# Patient Record
Sex: Male | Born: 1995 | Race: White | Hispanic: No | Marital: Single | Smoking: Never smoker
Health system: Southern US, Community
[De-identification: ages and names within clinical notes are randomized; demographics above are authoritative.]

## PROBLEM LIST (undated history)

## (undated) DIAGNOSIS — F909 Attention-deficit hyperactivity disorder, unspecified type: Secondary | ICD-10-CM

## (undated) HISTORY — PX: OTHER SURGICAL HISTORY: SHX169

## (undated) HISTORY — DX: Attention-deficit hyperactivity disorder, unspecified type: F90.9

---
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
## 2015-11-10 ENCOUNTER — Emergency Department (HOSPITAL_COMMUNITY)
Admission: EM | Admit: 2015-11-10 | Discharge: 2015-11-10 | Disposition: A | Discharge status: Home / Self Care | Payer: 59 | Attending: Emergency Medicine | Admitting: Emergency Medicine

## 2015-11-10 ENCOUNTER — Encounter (HOSPITAL_COMMUNITY): Payer: Self-pay | Admitting: Emergency Medicine

## 2015-11-10 DIAGNOSIS — Z8659 Personal history of other mental and behavioral disorders: Secondary | ICD-10-CM | POA: Diagnosis not present

## 2015-11-10 DIAGNOSIS — R630 Anorexia: Secondary | ICD-10-CM | POA: Diagnosis not present

## 2015-11-10 DIAGNOSIS — J028 Acute pharyngitis due to other specified organisms: Secondary | ICD-10-CM | POA: Diagnosis not present

## 2015-11-10 DIAGNOSIS — J029 Acute pharyngitis, unspecified: Secondary | ICD-10-CM

## 2015-11-10 DIAGNOSIS — B9689 Other specified bacterial agents as the cause of diseases classified elsewhere: Secondary | ICD-10-CM | POA: Insufficient documentation

## 2015-11-10 LAB — RAPID STREP SCREEN (MED CTR MEBANE ONLY): Streptococcus, Group A Screen (Direct): NEGATIVE

## 2015-11-10 MED ORDER — DEXAMETHASONE SODIUM PHOSPHATE 10 MG/ML IJ SOLN
10.0000 mg | Freq: Once | INTRAMUSCULAR | Status: AC
  Administered 2015-11-10: 10 mg via INTRAMUSCULAR
  Filled 2015-11-10: qty 1

## 2015-11-10 MED ORDER — PENICILLIN G BENZATHINE 1200000 UNIT/2ML IM SUSP
1.2000 10*6.[IU] | Freq: Once | INTRAMUSCULAR | Status: AC
  Administered 2015-11-10: 1.2 10*6.[IU] via INTRAMUSCULAR
  Filled 2015-11-10: qty 2

## 2015-11-10 NOTE — Discharge Instructions (Signed)
1. Medications: usual home medications 2. Treatment: rest, drink plenty of fluids, Tylenol and ibuprofen for sore throat 3. Follow Up: Please followup with your primary doctor in 3-5 days for discussion of your diagnoses and further evaluation after today's visit; if you do not have a primary care doctor use the resource guide provided to find one; Please return to the ER for difficulty breathing, difficulty swallowing him a high fevers or other concerns    Pharyngitis Pharyngitis is redness, pain, and swelling (inflammation) of your pharynx.  CAUSES  Pharyngitis is usually caused by infection. Most of the time, these infections are from viruses (viral) and are part of a cold. However, sometimes pharyngitis is caused by bacteria (bacterial). Pharyngitis can also be caused by allergies. Viral pharyngitis may be spread from person to person by coughing, sneezing, and personal items or utensils (cups, forks, spoons, toothbrushes). Bacterial pharyngitis may be spread from person to person by more intimate contact, such as kissing.  SIGNS AND SYMPTOMS  Symptoms of pharyngitis include:   Sore throat.   Tiredness (fatigue).   Low-grade fever.   Headache.  Joint pain and muscle aches.  Skin rashes.  Swollen lymph nodes.  Plaque-like film on throat or tonsils (often seen with bacterial pharyngitis). DIAGNOSIS  Your health care provider will ask you questions about your illness and your symptoms. Your medical history, along with a physical exam, is often all that is needed to diagnose pharyngitis. Sometimes, a rapid strep test is done. Other lab tests may also be done, depending on the suspected cause.  TREATMENT  Viral pharyngitis will usually get better in 3-4 days without the use of medicine. Bacterial pharyngitis is treated with medicines that kill germs (antibiotics).  HOME CARE INSTRUCTIONS   Drink enough water and fluids to keep your urine clear or pale yellow.   Only take  over-the-counter or prescription medicines as directed by your health care provider:   If you are prescribed antibiotics, make sure you finish them even if you start to feel better.   Do not take aspirin.   Get lots of rest.   Gargle with 8 oz of salt water ( tsp of salt per 1 qt of water) as often as every 1-2 hours to soothe your throat.   Throat lozenges (if you are not at risk for choking) or sprays may be used to soothe your throat. SEEK MEDICAL CARE IF:   You have large, tender lumps in your neck.  You have a rash.  You cough up green, yellow-brown, or bloody spit. SEEK IMMEDIATE MEDICAL CARE IF:   Your neck becomes stiff.  You drool or are unable to swallow liquids.  You vomit or are unable to keep medicines or liquids down.  You have severe pain that does not go away with the use of recommended medicines.  You have trouble breathing (not caused by a stuffy nose). MAKE SURE YOU:   Understand these instructions.  Will watch your condition.  Will get help right away if you are not doing well or get worse.   This information is not intended to replace advice given to you by your health care provider. Make sure you discuss any questions you have with your health care provider.   Document Released: 10/20/2005 Document Revised: 08/10/2013 Document Reviewed: 06/27/2013 Elsevier Interactive Patient Education [employer]2016 [employer]

## 2015-11-10 NOTE — ED Provider Notes (Signed)
CSN: [id number]     Arrival date & time 11/10/15  1422 History  By signing my name below, I, Joseph Sosa, attest that this documentation has been prepared under the direction and in the presence of [facility] Mut[facility] PA-C. Electronically Signed: Bethel BornBritney Sosa, ED Scribe. 11/10/2015 4:13 PM   Chief Complaint  Patient presents with  . Sore Throat    The history is provided by the patient and medical records. No language interpreter was used.   Joseph Sosa is a 20 y.o. male who presents to the Emergency Department complaining of new, constant, 4/10 in severity sore throat with onset 3 days ago. The pain is worse with swallowing but pt denies difficulty swallowing. He notes that this pain is different than pain that he had in the past with mono. He used Advil and Sudafed for pain at home. Associated symptoms include chills, throat swelling, lymphadenopathy, nasal congestion, ear pain, mild cough, decreased appetite (last ate sushi last night). Pt denies fever, rhinorrhea, and SOB.   Past Medical History  Diagnosis Date  . ADHD (attention deficit hyperactivity disorder)    History reviewed. No pertinent past surgical history. No family history on file. Social History  Substance Use Topics  . Smoking status: Never Smoker   . Smokeless tobacco: Never Used  . Alcohol Use: No    Review of Systems  Constitutional: Positive for chills and appetite change. Negative for fever.  HENT: Positive for congestion, ear pain and sore throat. Negative for rhinorrhea and trouble swallowing.   Respiratory: Positive for cough. Negative for shortness of breath.   Hematological: Positive for adenopathy.    Allergies  Review of patient's allergies indicates no known allergies.  Home Medications   Prior to Admission medications   Medication Sig Start Date End Date Taking? Authorizing Provider  diphenhydrAMINE (BENADRYL) 25 mg capsule Take 25 mg by mouth every 6 (six) hours as needed for  allergies.   Yes Historical Provider, MD  ibuprofen (ADVIL,MOTRIN) 200 MG tablet Take 200 mg by mouth every 6 (six) hours as needed for moderate pain.   Yes Historical Provider, MD  phenylephrine (SUDAFED PE) 10 MG TABS tablet Take 10 mg by mouth every 4 (four) hours as needed (congestion).   Yes Historical Provider, MD   BP 98/65 mmHg  Pulse 88  Temp(Src) 98.4 F (36.9 C) (Oral)  Resp 16  SpO2 99% Physical Exam  Constitutional: He appears well-developed and well-nourished. No distress.  HENT:  Head: Normocephalic and atraumatic.  Right Ear: Tympanic membrane, external ear and ear canal normal.  Left Ear: Tympanic membrane, external ear and ear canal normal.  Nose: Nose normal. No mucosal edema or rhinorrhea.  Mouth/Throat: Uvula is midline and mucous membranes are normal. Mucous membranes are not dry. No trismus in the jaw. No uvula swelling. Oropharyngeal exudate, posterior oropharyngeal edema and posterior oropharyngeal erythema present. No tonsillar abscesses.  Posterior oropharynx with erythema, edema and exudate on the tonsils  Eyes: Conjunctivae are normal.  Neck: Normal range of motion, full passive range of motion without pain and phonation normal. No tracheal tenderness, no spinous process tenderness and no muscular tenderness present. No rigidity. No erythema and normal range of motion present. No Brudzinski's sign and no Kernig's sign noted.  Range of motion without pain  No midline or paraspinal tenderness Normal phonation No stridor Handling secretions without difficulty No nuchal rigidity or meningeal signs  Cardiovascular: Normal rate, regular rhythm and normal heart sounds.   Pulses:      Radial  pulses are 2+ on the right side, and 2+ on the left side.  Pulmonary/Chest: Effort normal and breath sounds normal. No stridor. No respiratory distress. He has no decreased breath sounds. He has no wheezes.  Equal chest expansion, clear and equal breath sounds without focal  wheezes, rhonchi or rales  Musculoskeletal: Normal range of motion.  Lymphadenopathy:       Head (right side): Submandibular and tonsillar adenopathy present. No submental, no preauricular, no posterior auricular and no occipital adenopathy present.       Head (left side): Submandibular and tonsillar adenopathy present. No submental, no preauricular, no posterior auricular and no occipital adenopathy present.    He has cervical adenopathy (Moderate).       Right cervical: No superficial cervical, no deep cervical and no posterior cervical adenopathy present.      Left cervical: No superficial cervical, no deep cervical and no posterior cervical adenopathy present.  Neurological: He is alert.  Alert and oriented Moves all extremities without ataxia  Skin: Skin is warm and dry. He is not diaphoretic.  Psychiatric: He has a normal mood and affect.  Nursing note and vitals reviewed.   ED Course  Procedures (including critical care time) DIAGNOSTIC STUDIES: Oxygen Saturation is 99% on RA,  normal by my interpretation.    COORDINATION OF CARE: 3:52 PM Discussed treatment plan which includes rapid strep screen, Decadron, and penicillin with pt at bedside and pt agreed to plan.  Labs Review Labs Reviewed  RAPID STREP SCREEN (NOT AT [facility])  CULTURE, GROUP A STREP      MDM   Final diagnoses:  Bacterial pharyngitis  Sore throat   Joseph Sosa  Pt afebrile with tonsillar exudate, cervical lymphadenopathy, & dysphagia; diagnosis of bacterial pharyngitis. Treated in the ED with steroids and PCN IM.  Pt does not appear dehydrated however, discussed importance of water rehydration. Presentation non concerning for PTA or infxn spread to soft tissue. No trismus or uvula deviation. Specific return precautions discussed. Pt able to drink water in ED without difficulty with intact air way. Recommended PCP follow up.    I personally performed the services described in this documentation, which  was scribed in my presence. The recorded information has been reviewed and is accurate.   Dahlia Client Ashara Lounsbury, PA-C 11/10/15 1618  Leta Baptist, MD 11/11/15 [phone]

## 2015-11-10 NOTE — ED Notes (Signed)
Per pt/mother-states family has had colds-patient states sore throat for 3 days

## 2015-11-13 LAB — CULTURE, GROUP A STREP: Strep A Culture: NEGATIVE

## 2016-03-21 ENCOUNTER — Ambulatory Visit (INDEPENDENT_AMBULATORY_CARE_PROVIDER_SITE_OTHER): Payer: 59 | Admitting: Family Medicine

## 2016-03-21 VITALS — BP 112/76 | HR 98 | Temp 99.2°F | Resp 16 | Ht 72.0 in | Wt 148.0 lb

## 2016-03-21 DIAGNOSIS — D696 Thrombocytopenia, unspecified: Secondary | ICD-10-CM

## 2016-03-21 DIAGNOSIS — R112 Nausea with vomiting, unspecified: Secondary | ICD-10-CM

## 2016-03-21 DIAGNOSIS — R509 Fever, unspecified: Secondary | ICD-10-CM

## 2016-03-21 DIAGNOSIS — B349 Viral infection, unspecified: Secondary | ICD-10-CM | POA: Diagnosis not present

## 2016-03-21 DIAGNOSIS — J029 Acute pharyngitis, unspecified: Secondary | ICD-10-CM

## 2016-03-21 LAB — POCT CBC
GRANULOCYTE PERCENT: 88.4 % — AB (ref 37–80)
HEMATOCRIT: 43.2 % — AB (ref 43.5–53.7)
HEMOGLOBIN: 15.6 g/dL (ref 14.1–18.1)
Lymph, poc: 0.7 (ref 0.6–3.4)
MCH, POC: 30.2 pg (ref 27–31.2)
MCHC: 36.1 g/dL — AB (ref 31.8–35.4)
MCV: 83.8 fL (ref 80–97)
MID (cbc): 0.2 (ref 0–0.9)
MPV: 7.8 fL (ref 0–99.8)
POC GRANULOCYTE: 7.1 — AB (ref 2–6.9)
POC LYMPH PERCENT: 8.6 %L — AB (ref 10–50)
POC MID %: 3 %M (ref 0–12)
Platelet Count, POC: 131 10*3/uL — AB (ref 142–424)
RBC: 5.15 M/uL (ref 4.69–6.13)
RDW, POC: 14.3 %
WBC: 8 10*3/uL (ref 4.6–10.2)

## 2016-03-21 LAB — POCT URINALYSIS DIP (MANUAL ENTRY)
Bilirubin, UA: NEGATIVE
GLUCOSE UA: NEGATIVE
Ketones, POC UA: NEGATIVE
Leukocytes, UA: NEGATIVE
Nitrite, UA: NEGATIVE
PH UA: 6
Protein Ur, POC: NEGATIVE
RBC UA: NEGATIVE
Urobilinogen, UA: 0.2

## 2016-03-21 LAB — POC MICROSCOPIC URINALYSIS (UMFC): Mucus: ABSENT

## 2016-03-21 LAB — POCT RAPID STREP A (OFFICE): Rapid Strep A Screen: NEGATIVE

## 2016-03-21 LAB — COMPREHENSIVE METABOLIC PANEL
ALBUMIN: 4.6 g/dL (ref 3.6–5.1)
ALK PHOS: 50 U/L (ref 48–230)
ALT: 10 U/L (ref 8–46)
AST: 17 U/L (ref 12–32)
BUN: 12 mg/dL (ref 7–20)
CO2: 23 mmol/L (ref 20–31)
CREATININE: 0.92 mg/dL (ref 0.60–1.26)
Calcium: 9.5 mg/dL (ref 8.9–10.4)
Chloride: 96 mmol/L — ABNORMAL LOW (ref 98–110)
Glucose, Bld: 97 mg/dL (ref 65–99)
Potassium: 4 mmol/L (ref 3.8–5.1)
SODIUM: 133 mmol/L — AB (ref 135–146)
TOTAL PROTEIN: 7.3 g/dL (ref 6.3–8.2)
Total Bilirubin: 0.4 mg/dL (ref 0.2–1.1)

## 2016-03-21 LAB — POCT SEDIMENTATION RATE: POCT SED RATE: 13 mm/h (ref 0–22)

## 2016-03-21 LAB — C-REACTIVE PROTEIN: CRP: 11.2 mg/dL — AB (ref <0.60)

## 2016-03-21 LAB — LIPASE: LIPASE: 11 U/L (ref 7–60)

## 2016-03-21 MED ORDER — ONDANSETRON 8 MG PO TBDP
8.0000 mg | ORAL_TABLET | Freq: Three times a day (TID) | ORAL | Status: AC | PRN
Start: 2016-03-21 — End: ?

## 2016-03-21 MED ORDER — ONDANSETRON 4 MG PO TBDP
8.0000 mg | ORAL_TABLET | Freq: Once | ORAL | Status: AC
  Administered 2016-03-21: 8 mg via ORAL

## 2016-03-21 NOTE — Progress Notes (Signed)
Subjective:  This chart was scribed for Joseph Sorenson MD, by [name], at [facility].  This patient was seen in room 12 and the patient's care was started at 11:01 AM.   Chief Complaint  Patient presents with  . Fever    x 3 days   . Sore Throat  . Emesis  . Diarrhea    x 1 day   . Chills  . loss of appetite  . Back Pain     Patient ID: Joseph Sosa, male    DOB: [birth year], 20 y.o.   MRN: [id number]  HPI HPI Comments: Joseph Sosa is a 20 y.o. male who presents to the [facility] complaining multiple symptoms today. Patient woke up with a headache (not as bad today) three days ago[place] and after he came [place], he had a fever/chills (104).  The next day he had a sore throat followed by diarrhea (worst yesterday- was up "every 2 minutes with large volume )- last episode was last night, emesis (last night) and back pain. Patient has been taking tylenol every 4-6 hours and last took it this morning. Patient has associated symptoms of loss of appetite due to nausea. Patient has not eaten or had anything to drink this morning (besides bread) .  He has some dysuria. Denies dizziness neck pain, light sensitivity, rashes or tick bites.   Bitemporal headache.   Patient has had mononucleosis and has had     Past Medical History  Diagnosis Date  . ADHD (attention deficit hyperactivity disorder)     No current outpatient prescriptions on file prior to visit.   No current facility-administered medications on file prior to visit.    No Known Allergies    Review of Systems  Constitutional: Positive for fever, chills and appetite change.  HENT: Positive for sore throat.   Eyes: Negative for photophobia, pain, redness and visual disturbance.  Respiratory: Negative for cough, choking and shortness of breath.   Gastrointestinal: Positive for nausea, vomiting and diarrhea. Negative for constipation.  Genitourinary:  Positive for dysuria.  Musculoskeletal: Positive for back pain. Negative for neck pain and neck stiffness.  Neurological: Positive for headaches. Negative for seizures, syncope and speech difficulty.       Objective:   Physical Exam  Constitutional: He is oriented to person, place, and time. He appears well-developed and well-nourished. He does not appear ill. No distress.  HENT:  Head: Normocephalic and atraumatic.  Cerumen bilaterally left TM is normal.  Erythema of the oropharynx without edema or exudate.   Neck: Normal range of motion and full passive range of motion without pain. No rigidity. Normal range of motion present. No Brudzinski's sign and no Kernig's sign noted. No thyroid mass and no thyromegaly present.  No lymphadenopathy.    Cardiovascular: Normal rate, regular rhythm, S1 normal, S2 normal and normal heart sounds.  Exam reveals no gallop and no friction rub.   No murmur heard. Pulmonary/Chest: Effort normal and breath sounds normal. No respiratory distress. He has no wheezes. He has no rales.  Abdominal: Soft. He exhibits no distension. There is no tenderness. There is negative Murphy's sign.  hyperactive tympanic bowel sounds soft non distended, non tender,   Musculoskeletal:       Cervical back: He exhibits normal range of motion, no pain and no spasm.  Lymphadenopathy:       Head (right side): No submandibular and no tonsillar adenopathy present.  Head (left side): No submandibular and no tonsillar adenopathy present.    He has no cervical adenopathy.       Right: No supraclavicular adenopathy present.       Left: No supraclavicular adenopathy present.  Neurological: He is alert and oriented to person, place, and time. He has normal strength. He exhibits normal muscle tone. Coordination and gait normal.  Neg Kernig's and Bruzinski's.   Skin: Skin is warm and dry. No bruising, no ecchymosis, no petechiae and no rash noted. He is not diaphoretic. No erythema.    Psychiatric: He has a normal mood and affect. His behavior is normal.   Filed Vitals:   03/21/16 1042  BP: 112/76  Pulse: 98  Temp: 99.2 F (37.3 C)  TempSrc: Oral  Resp: 16  Height: 6' (1.829 m)  Weight: 148 lb (67.132 kg)  SpO2: 100%   Results for orders placed or performed in visit on 03/21/16  POCT CBC  Result Value Ref Range   WBC 8.0 4.6 - 10.2 K/uL   Lymph, poc 0.7 0.6 - 3.4   POC LYMPH PERCENT 8.6 (A) 10 - 50 %L   MID (cbc) 0.2 0 - 0.9   POC MID % 3.0 0 - 12 %M   POC Granulocyte 7.1 (A) 2 - 6.9   Granulocyte percent 88.4 (A) 37 - 80 %G   RBC 5.15 4.69 - 6.13 M/uL   Hemoglobin 15.6 14.1 - 18.1 g/dL   HCT, POC 16.143.2 (A) 09.643.5 - 53.7 %   MCV 83.8 80 - 97 fL   MCH, POC 30.2 27 - 31.2 pg   MCHC 36.1 (A) 31.8 - 35.4 g/dL   RDW, POC 04.514.3 %   Platelet Count, POC 131 (A) 142 - 424 K/uL   MPV 7.8 0 - 99.8 fL  POCT rapid strep A  Result Value Ref Range   Rapid Strep A Screen Negative Negative  POCT urinalysis dipstick  Result Value Ref Range   Color, UA yellow yellow   Clarity, UA clear clear   Glucose, UA negative negative   Bilirubin, UA negative negative   Ketones, POC UA negative negative   Spec Grav, UA <=1.005    Blood, UA negative negative   pH, UA 6.0    Protein Ur, POC negative negative   Urobilinogen, UA 0.2    Nitrite, UA Negative Negative   Leukocytes, UA Negative Negative  POCT Microscopic Urinalysis ([facility])  Result Value Ref Range   WBC,UR,HPF,POC None None WBC/hpf   RBC,UR,HPF,POC None None RBC/hpf   Bacteria None None, Too numerous to count   Mucus Absent Absent   Epithelial Cells, UR Per Microscopy None None, Too numerous to count cells/hpf          Assessment & Plan:   1. Fever, unspecified   2. Non-intractable vomiting with nausea, unspecified vomiting type   3. Acute pharyngitis, unspecified etiology   4. Thrombocytopenia (HCC)   5. Acute viral syndrome    Suspect viral due to thrombocytopenia.  Push fluids, sleep, symptomatic  care.  Recheck in 24 hrs for repeat cbc. If any rashes develop, RTC or to ER immed - fortunately none now and no known tics as sxs are otherwise c/w RMSF - could be atypical so titers P. Pt has had mono prior and current sxs similar - check abx to r/o poss recurrence.  Orders Placed This Encounter  Procedures  . Culture, Group A Strep    Order Specific Question:  Source  Answer:  oropharynx  . Comprehensive metabolic panel  . Lipase  . Epstein-Barr virus VCA antibody panel  . [place] spotted fvr abs pnl(IgG+IgM)  . C-reactive protein  . POCT CBC  . POCT SEDIMENTATION RATE  . POCT rapid strep A  . POCT urinalysis dipstick  . POCT Microscopic Urinalysis ([facility])    Meds ordered this encounter  Medications  . acetaminophen (TYLENOL) 325 MG tablet    Sig: Take 650 mg by mouth every 6 (six) hours as needed.  Marland Kitchen MELATONIN ER PO    Sig: Take by mouth.  . loperamide (IMODIUM A-D) 2 MG tablet    Sig: Take 2 mg by mouth 4 (four) times daily as needed for diarrhea or loose stools.  . ondansetron (ZOFRAN-ODT) disintegrating tablet 8 mg    Sig:   . ondansetron (ZOFRAN-ODT) 8 MG disintegrating tablet    Sig: Take 1 tablet (8 mg total) by mouth every 8 (eight) hours as needed for nausea.    Dispense:  30 tablet    Refill:  0    I personally performed the services described in this documentation, which was scribed in my presence. The recorded information has been reviewed and considered, and addended by me as needed.  Joseph Sorenson, MD MPH

## 2016-03-21 NOTE — Patient Instructions (Addendum)
IF you received an x-ray today, you will receive an invoice from [facility]. Please contact [facility] at [phone] with questions or concerns regarding your invoice.   IF you received labwork today, you will receive an invoice from [employer]. Please contact [facility] at [phone] with questions or concerns regarding your invoice.   Our billing staff will not be able to assist you with questions regarding bills from these companies.  You will be contacted with the lab results as soon as they are available. The fastest way to get your results is to activate your My Chart account. Instructions are located on the last page of this paperwork. If you have not heard from [place] regarding the results in 2 weeks, please contact this office.   Thrombocytopenia Thrombocytopenia is a condition in which there is an abnormally small number of platelets in your blood. Platelets are also called thrombocytes. Platelets are needed for blood clotting. CAUSES Thrombocytopenia is caused by:   Decreased production of platelets. This can be caused by:  Aplastic anemia in which your bone marrow quits making blood cells.  Cancer in the bone marrow.  Use of certain medicines, including chemotherapy.  Infection in the bone marrow.  Heavy alcohol consumption.  Increased destruction of platelets. This can be caused by:  Certain immune diseases.  Use of certain drugs.  Certain blood clotting disorders.  Certain inherited disorders.  Certain bleeding disorders.  Pregnancy.  Having an enlarged spleen (hypersplenism). In hypersplenism, the spleen gathers up platelets from circulation. This means the platelets are not available to help with blood clotting. The spleen can enlarge due to cirrhosis or other conditions. SYMPTOMS  The symptoms of thrombocytopenia are side effects of poor blood clotting. Some of these are:  Abnormal  bleeding.  Nosebleeds.  Heavy menstrual periods.  Blood in the urine or stools.  Purpura. This is a purplish discoloration in the skin produced by small bleeding vessels near the surface of the skin.  Bruising.  A rash that may be petechial. This looks like pinpoint, purplish-red spots on the skin and mucous membranes. It is caused by bleeding from small blood vessels (capillaries). DIAGNOSIS  Your caregiver will make this diagnosis based on your exam and blood tests. Sometimes, a bone marrow study is done to look for the original cells (megakaryocytes) that make platelets. TREATMENT  Treatment depends on the cause of the condition.  Medicines may be given to help protect your platelets from being destroyed.  In some cases, a replacement (transfusion) of platelets may be required to stop or prevent bleeding.  Sometimes, the spleen must be surgically removed. HOME CARE INSTRUCTIONS   Check the skin and linings inside your mouth for bruising or bleeding as directed by your caregiver.  Check your sputum, urine, and stool for blood as directed by your caregiver.  Do not return to any activities that could cause bumps or bruises until your caregiver says it is okay.  Take extra care not to cut yourself when shaving or when using scissors, needles, knives, and other tools.  Take extra care not to burn yourself when ironing or cooking.  Ask your caregiver if it is okay for you to drink alcohol.  Only take over-the-counter or prescription medicines as directed by your caregiver.  Notify all your caregivers, including dentists and eye doctors, about your condition. SEEK IMMEDIATE MEDICAL CARE IF:   You develop active bleeding from anywhere in your body.  You develop unexplained bruising or  bleeding.  You have blood in your sputum, urine, or stool. MAKE SURE YOU:  Understand these instructions.  Will watch your condition.  Will get help right away if you are not doing well  or get worse.   This information is not intended to replace advice given to you by your health care provider. Make sure you discuss any questions you have with your health care provider.   Document Released: 10/20/2005 Document Revised: 01/12/2012 Document Reviewed: 04/23/2015 Elsevier Interactive Patient Education 2016 [employer]  Dehydration, Adult Dehydration is a condition in which you do not have enough fluid or water in your body. It happens when you take in less fluid than you lose. Vital organs such as the kidneys, brain, and heart cannot function without a proper amount of fluids. Any loss of fluids from the body can cause dehydration.  Dehydration can range from mild to severe. This condition should be treated right away to help prevent it from becoming severe. CAUSES  This condition may be caused by:  Vomiting.  Diarrhea.  Excessive sweating, such as when exercising in hot or humid weather.  Not drinking enough fluid during strenuous exercise or during an illness.  Excessive urine output.  Fever.  Certain medicines. RISK FACTORS This condition is more likely to develop in:  People who are taking certain medicines that cause the body to lose excess fluid (diuretics).   People who have a chronic illness, such as diabetes, that may increase urination.  Older adults.   People who live at high altitudes.   People who participate in endurance sports.  SYMPTOMS  Mild Dehydration  Thirst.  Dry lips.  Slightly dry mouth.  Dry, warm skin. Moderate Dehydration  Very dry mouth.   Muscle cramps.   Dark urine and decreased urine production.   Decreased tear production.   Headache.   Light-headedness, especially when you stand up from a sitting position.  Severe Dehydration  Changes in skin.   Cold and clammy skin.   Skin does not spring back quickly when lightly pinched and released.   Changes in body fluids.   Extreme thirst.    No tears.   Not able to sweat when body temperature is high, such as in hot weather.   Minimal urine production.   Changes in vital signs.   Rapid, weak pulse (more than 100 beats per minute when you are sitting still).   Rapid breathing.   Low blood pressure.   Other changes.   Sunken eyes.   Cold hands and feet.   Confusion.  Lethargy and difficulty being awakened.  Fainting (syncope).   Short-term weight loss.   Unconsciousness. DIAGNOSIS  This condition may be diagnosed based on your symptoms. You may also have tests to determine how severe your dehydration is. These tests may include:   Urine tests.   Blood tests.  TREATMENT  Treatment for this condition depends on the severity. Mild or moderate dehydration can often be treated at home. Treatment should be started right away. Do not wait until dehydration becomes severe. Severe dehydration needs to be treated at the hospital. Treatment for Mild Dehydration  Drinking plenty of water to replace the fluid you have lost.   Replacing minerals in your blood (electrolytes) that you may have lost.  Treatment for Moderate Dehydration  Consuming oral rehydration solution (ORS). Treatment for Severe Dehydration  Receiving fluid through an IV tube.   Receiving electrolyte solution through a feeding tube that is passed through your nose  and into your stomach (nasogastric tube or NG tube).  Correcting any abnormalities in electrolytes. HOME CARE INSTRUCTIONS   Drink enough fluid to keep your urine clear or pale yellow.   Drink water or fluid slowly by taking small sips. You can also try sucking on ice cubes.  Have food or beverages that contain electrolytes. Examples include bananas and sports drinks.  Take over-the-counter and prescription medicines only as told by your health care provider.   Prepare ORS according to the manufacturer's instructions. Take sips of ORS every 5 minutes  until your urine returns to normal.  If you have vomiting or diarrhea, continue to try to drink water, ORS, or both.   If you have diarrhea, avoid:   Beverages that contain caffeine.   Fruit juice.   Milk.   Carbonated soft drinks.  Do not take salt tablets. This can lead to the condition of having too much sodium in your body (hypernatremia).  SEEK MEDICAL CARE IF:  You cannot eat or drink without vomiting.  You have had moderate diarrhea during a period of more than 24 hours.  You have a fever. SEEK IMMEDIATE MEDICAL CARE IF:   You have extreme thirst.  You have severe diarrhea.  You have not urinated in 6-8 hours, or you have urinated only a small amount of very dark urine.  You have shriveled skin.  You are dizzy, confused, or both.   This information is not intended to replace advice given to you by your health care provider. Make sure you discuss any questions you have with your health care provider.   Document Released: 10/20/2005 Document Revised: 07/11/2015 Document Reviewed: 03/07/2015 Elsevier Interactive Patient Education [employer]

## 2016-03-22 ENCOUNTER — Telehealth: Payer: Self-pay | Admitting: Emergency Medicine

## 2016-03-22 ENCOUNTER — Ambulatory Visit (INDEPENDENT_AMBULATORY_CARE_PROVIDER_SITE_OTHER): Payer: 59 | Admitting: Family Medicine

## 2016-03-22 VITALS — BP 110/76 | HR 86 | Temp 98.4°F | Resp 16

## 2016-03-22 DIAGNOSIS — E871 Hypo-osmolality and hyponatremia: Secondary | ICD-10-CM

## 2016-03-22 DIAGNOSIS — R809 Proteinuria, unspecified: Secondary | ICD-10-CM

## 2016-03-22 DIAGNOSIS — J029 Acute pharyngitis, unspecified: Secondary | ICD-10-CM

## 2016-03-22 DIAGNOSIS — R509 Fever, unspecified: Secondary | ICD-10-CM

## 2016-03-22 DIAGNOSIS — E860 Dehydration: Secondary | ICD-10-CM

## 2016-03-22 LAB — POCT URINALYSIS DIP (MANUAL ENTRY)
Glucose, UA: NEGATIVE
Leukocytes, UA: NEGATIVE
Nitrite, UA: NEGATIVE
SPEC GRAV UA: 1.015
Urobilinogen, UA: 1
pH, UA: 6

## 2016-03-22 LAB — POC MICROSCOPIC URINALYSIS (UMFC): MUCUS RE: ABSENT

## 2016-03-22 LAB — COMPREHENSIVE METABOLIC PANEL
ALK PHOS: 81 U/L (ref 48–230)
ALT: 23 U/L (ref 8–46)
AST: 32 U/L (ref 12–32)
Albumin: 4.8 g/dL (ref 3.6–5.1)
BILIRUBIN TOTAL: 0.4 mg/dL (ref 0.2–1.1)
BUN: 12 mg/dL (ref 7–20)
CHLORIDE: 95 mmol/L — AB (ref 98–110)
CO2: 27 mmol/L (ref 20–31)
CREATININE: 1.12 mg/dL (ref 0.60–1.26)
Calcium: 9.4 mg/dL (ref 8.9–10.4)
Glucose, Bld: 97 mg/dL (ref 65–99)
Potassium: 4.2 mmol/L (ref 3.8–5.1)
SODIUM: 134 mmol/L — AB (ref 135–146)
TOTAL PROTEIN: 7.4 g/dL (ref 6.3–8.2)

## 2016-03-22 LAB — POCT CBC
GRANULOCYTE PERCENT: 72.2 % (ref 37–80)
HCT, POC: 43.3 % — AB (ref 43.5–53.7)
HEMOGLOBIN: 15.6 g/dL (ref 14.1–18.1)
Lymph, poc: 1.3 (ref 0.6–3.4)
MCH: 30.4 pg (ref 27–31.2)
MCHC: 35.9 g/dL — AB (ref 31.8–35.4)
MCV: 84.6 fL (ref 80–97)
MID (CBC): 0.4 (ref 0–0.9)
MPV: 7.4 fL (ref 0–99.8)
POC GRANULOCYTE: 4.5 (ref 2–6.9)
POC LYMPH PERCENT: 21.3 %L (ref 10–50)
POC MID %: 6.5 % (ref 0–12)
Platelet Count, POC: 133 10*3/uL — AB (ref 142–424)
RBC: 5.12 M/uL (ref 4.69–6.13)
RDW, POC: 14.3 %
WBC: 6.3 10*3/uL (ref 4.6–10.2)

## 2016-03-22 LAB — CULTURE, GROUP A STREP: Organism ID, Bacteria: NORMAL

## 2016-03-22 LAB — C-REACTIVE PROTEIN: CRP: 12.9 mg/dL — ABNORMAL HIGH (ref <0.60)

## 2016-03-22 LAB — POCT SEDIMENTATION RATE: POCT SED RATE: 23 mm/hr — AB (ref 0–22)

## 2016-03-22 MED ORDER — HYDROCODONE-ACETAMINOPHEN 7.5-325 MG/15ML PO SOLN: 10.0000 mL | ORAL | Status: AC | PRN

## 2016-03-22 NOTE — Patient Instructions (Addendum)
I still suspect you have some specific viral syndrome like mono (Epstein-Barr), cytomegalovirus, and still haven't ruled out a tick-borne illness such as [facility].  Recheck with me in 48 hours - if worsening prior to the -> go to ER.  Keep zofran (for nausea) on board and pain medicine so you can just sleep, make sure you are drinking as many fluids as possible.  IF you received an x-ray today, you will receive an invoice from [facility]. Please contact [facility] at [phone] with questions or concerns regarding your invoice.   IF you received labwork today, you will receive an invoice from [employer]. Please contact [facility] at [phone] with questions or concerns regarding your invoice.   Our billing staff will not be able to assist you with questions regarding bills from these companies.  You will be contacted with the lab results as soon as they are available. The fastest way to get your results is to activate your My Chart account. Instructions are located on the last page of this paperwork. If you have not heard from [place] regarding the results in 2 weeks, please contact this office.     Food Choices to Help Relieve Diarrhea, Adult When you have diarrhea, the foods you eat and your eating habits are very important. Choosing the right foods and drinks can help relieve diarrhea. Also, because diarrhea can last up to 7 days, you need to replace lost fluids and electrolytes (such as sodium, potassium, and chloride) in order to help prevent dehydration.  WHAT GENERAL GUIDELINES DO I NEED TO FOLLOW?  Slowly drink 1 cup (8 oz) of fluid for each episode of diarrhea. If you are getting enough fluid, your urine will be clear or pale yellow.  Eat starchy foods. Some good choices include white rice, white toast, pasta, low-fiber cereal, baked potatoes (without the skin), saltine crackers, and bagels.  Avoid large servings of any cooked  vegetables.  Limit fruit to two servings per day. A serving is  cup or 1 small piece.  Choose foods with less than 2 g of fiber per serving.  Limit fats to less than 8 tsp (38 g) per day.  Avoid fried foods.  Eat foods that have probiotics in them. Probiotics can be found in certain dairy products.  Avoid foods and beverages that may increase the speed at which food moves through the stomach and intestines (gastrointestinal tract). Things to avoid include:  High-fiber foods, such as dried fruit, raw fruits and vegetables, nuts, seeds, and whole grain foods.  Spicy foods and high-fat foods.  Foods and beverages sweetened with high-fructose corn syrup, honey, or sugar alcohols such as xylitol, sorbitol, and mannitol. WHAT FOODS ARE RECOMMENDED? Grains White rice. White, [place], or pita breads (fresh or toasted), including plain rolls, buns, or bagels. White pasta. Saltine, soda, or graham crackers. Pretzels. Low-fiber cereal. Cooked cereals made with water (such as cornmeal, farina, or cream cereals). Plain muffins. Matzo. Melba toast. Zwieback.  Vegetables Potatoes (without the skin). Strained tomato and vegetable juices. Most well-cooked and canned vegetables without seeds. Tender lettuce. Fruits Cooked or canned applesauce, apricots, cherries, fruit cocktail, grapefruit, peaches, pears, or plums. Fresh bananas, apples without skin, cherries, grapes, cantaloupe, grapefruit, peaches, oranges, or plums.  Meat and Other Protein Products Baked or boiled chicken. Eggs. Tofu. Fish. Seafood. Smooth peanut butter. Ground or well-cooked tender beef, ham, veal, lamb, pork, or poultry.  Dairy Plain yogurt, kefir, and unsweetened liquid yogurt. Lactose-free milk, buttermilk, or  soy milk. Plain hard cheese. Beverages Sport drinks. Clear broths. Diluted fruit juices (except prune). Regular, caffeine-free sodas such as ginger ale. Water. Decaffeinated teas. Oral rehydration solutions. Sugar-free  beverages not sweetened with sugar alcohols. Other Bouillon, broth, or soups made from recommended foods.  The items listed above may not be a complete list of recommended foods or beverages. Contact your dietitian for more options. WHAT FOODS ARE NOT RECOMMENDED? Grains Whole grain, whole wheat, bran, or rye breads, rolls, pastas, crackers, and cereals. Wild or brown rice. Cereals that contain more than 2 g of fiber per serving. Corn tortillas or taco shells. Cooked or dry oatmeal. Granola. Popcorn. Vegetables Raw vegetables. Cabbage, broccoli, Brussels sprouts, artichokes, baked beans, beet greens, corn, kale, legumes, peas, sweet potatoes, and yams. Potato skins. Cooked spinach and cabbage. Fruits Dried fruit, including raisins and dates. Raw fruits. Stewed or dried prunes. Fresh apples with skin, apricots, mangoes, pears, raspberries, and strawberries.  Meat and Other Protein Products Chunky peanut butter. Nuts and seeds. Beans and lentils. Tomasa Blase.  Dairy High-fat cheeses. Milk, chocolate milk, and beverages made with milk, such as milk shakes. Cream. Ice cream. Sweets and Desserts Sweet rolls, doughnuts, and sweet breads. Pancakes and waffles. Fats and Oils Butter. Cream sauces. Margarine. Salad oils. Plain salad dressings. Olives. Avocados.  Beverages Caffeinated beverages (such as coffee, tea, soda, or energy drinks). Alcoholic beverages. Fruit juices with pulp. Prune juice. Soft drinks sweetened with high-fructose corn syrup or sugar alcohols. Other Coconut. Hot sauce. Chili powder. Mayonnaise. Gravy. Cream-based or milk-based soups.  The items listed above may not be a complete list of foods and beverages to avoid. Contact your dietitian for more information. WHAT SHOULD I DO IF I BECOME DEHYDRATED? Diarrhea can sometimes lead to dehydration. Signs of dehydration include dark urine and dry mouth and skin. If you think you are dehydrated, you should rehydrate with an oral rehydration  solution. These solutions can be purchased at pharmacies, retail stores, or online.  Drink -1 cup (120-240 mL) of oral rehydration solution each time you have an episode of diarrhea. If drinking this amount makes your diarrhea worse, try drinking smaller amounts more often. For example, drink 1-3 tsp (5-15 mL) every 5-10 minutes.  A general rule for staying hydrated is to drink 1-2 L of fluid per day. Talk to your health care provider about the specific amount you should be drinking each day. Drink enough fluids to keep your urine clear or pale yellow.   This information is not intended to replace advice given to you by your health care provider. Make sure you discuss any questions you have with your health care provider.   Document Released: 01/10/2004 Document Revised: 11/10/2014 Document Reviewed: 09/12/2013 Elsevier Interactive Patient Education 2016 Elsevier Inc. Dehydration, Adult Dehydration is a condition in which you do not have enough fluid or water in your body. It happens when you take in less fluid than you lose. Vital organs such as the kidneys, brain, and heart cannot function without a proper amount of fluids. Any loss of fluids from the body can cause dehydration.  Dehydration can range from mild to severe. This condition should be treated right away to help prevent it from becoming severe. CAUSES  This condition may be caused by:  Vomiting.  Diarrhea.  Excessive sweating, such as when exercising in hot or humid weather.  Not drinking enough fluid during strenuous exercise or during an illness.  Excessive urine output.  Fever.  Certain medicines. RISK FACTORS This condition  is more likely to develop in:  People who are taking certain medicines that cause the body to lose excess fluid (diuretics).   People who have a chronic illness, such as diabetes, that may increase urination.  Older adults.   People who live at high altitudes.   People who participate  in endurance sports.  SYMPTOMS  Mild Dehydration  Thirst.  Dry lips.  Slightly dry mouth.  Dry, warm skin. Moderate Dehydration  Very dry mouth.   Muscle cramps.   Dark urine and decreased urine production.   Decreased tear production.   Headache.   Light-headedness, especially when you stand up from a sitting position.  Severe Dehydration  Changes in skin.   Cold and clammy skin.   Skin does not spring back quickly when lightly pinched and released.   Changes in body fluids.   Extreme thirst.   No tears.   Not able to sweat when body temperature is high, such as in hot weather.   Minimal urine production.   Changes in vital signs.   Rapid, weak pulse (more than 100 beats per minute when you are sitting still).   Rapid breathing.   Low blood pressure.   Other changes.   Sunken eyes.   Cold hands and feet.   Confusion.  Lethargy and difficulty being awakened.  Fainting (syncope).   Short-term weight loss.   Unconsciousness. DIAGNOSIS  This condition may be diagnosed based on your symptoms. You may also have tests to determine how severe your dehydration is. These tests may include:   Urine tests.   Blood tests.  TREATMENT  Treatment for this condition depends on the severity. Mild or moderate dehydration can often be treated at home. Treatment should be started right away. Do not wait until dehydration becomes severe. Severe dehydration needs to be treated at the hospital. Treatment for Mild Dehydration  Drinking plenty of water to replace the fluid you have lost.   Replacing minerals in your blood (electrolytes) that you may have lost.  Treatment for Moderate Dehydration  Consuming oral rehydration solution (ORS). Treatment for Severe Dehydration  Receiving fluid through an IV tube.   Receiving electrolyte solution through a feeding tube that is passed through your nose and into your stomach  (nasogastric tube or NG tube).  Correcting any abnormalities in electrolytes. HOME CARE INSTRUCTIONS   Drink enough fluid to keep your urine clear or pale yellow.   Drink water or fluid slowly by taking small sips. You can also try sucking on ice cubes.  Have food or beverages that contain electrolytes. Examples include bananas and sports drinks.  Take over-the-counter and prescription medicines only as told by your health care provider.   Prepare ORS according to the manufacturer's instructions. Take sips of ORS every 5 minutes until your urine returns to normal.  If you have vomiting or diarrhea, continue to try to drink water, ORS, or both.   If you have diarrhea, avoid:   Beverages that contain caffeine.   Fruit juice.   Milk.   Carbonated soft drinks.  Do not take salt tablets. This can lead to the condition of having too much sodium in your body (hypernatremia).  SEEK MEDICAL CARE IF:  You cannot eat or drink without vomiting.  You have had moderate diarrhea during a period of more than 24 hours.  You have a fever. SEEK IMMEDIATE MEDICAL CARE IF:   You have extreme thirst.  You have severe diarrhea.  You have not urinated in  6-8 hours, or you have urinated only a small amount of very dark urine.  You have shriveled skin.  You are dizzy, confused, or both.   This information is not intended to replace advice given to you by your health care provider. Make sure you discuss any questions you have with your health care provider.   Document Released: 10/20/2005 Document Revised: 07/11/2015 Document Reviewed: 03/07/2015 Elsevier Interactive Patient Education [employer].

## 2016-03-22 NOTE — Progress Notes (Signed)
By signing my name below, I, Mesha Guinyard, attest that this documentation has been prepared under the direction and in the presence of [name], MD.  Electronically Signed: Arvilla Market, Medical Scribe. 03/22/2016. 9:23 AM.  Subjective:    Patient ID: Joseph Sosa, male    DOB: 08-14-1996, 20 y.o.   MRN: [id number]  HPI Chief Complaint  Patient presents with  . Follow-up    fatigue, back pain ; appetite loss   HPI Comments: Joseph Sosa is a 20 y.o. male who presents to the [facility] for a follow-up for fatigue, back pain, and appetite loss. He feels better. He's been laying in bed all day because of his backand at 10pm he went to sleep and woke up at 8am to come here. He still has dark urine, and he wasn't using the bathroom until 9pm due to medication that was prescribed. He mentions having feverish nightmares, and he's having night sweats that's getting better each night. He's experiencing nausea without emesis. He has postnasal drip, sore throat, dizziness, and HA so he took tylenol for relief. He's having some dysuria and sudden diarrhea yesterday. He denies penile discharge. testicular pain, or rashes.  Patient Active Problem List   Diagnosis Date Noted  . ADHD (attention deficit hyperactivity disorder) 11/19/2012  . Healthcare maintenance 12/04/2011   Past Medical History  Diagnosis Date  . ADHD (attention deficit hyperactivity disorder)    Past Surgical History  Procedure Laterality Date  . Toncillectomy     No Known Allergies Prior to Admission medications   Medication Sig Start Date End Date Taking? Authorizing Provider  acetaminophen (TYLENOL) 325 MG tablet Take 650 mg by mouth every 6 (six) hours as needed.   Yes Historical Provider, MD  loperamide (IMODIUM A-D) 2 MG tablet Take 2 mg by mouth 4 (four) times daily as needed for diarrhea or loose stools.   Yes Historical Provider, MD  MELATONIN ER PO Take by mouth.   Yes Historical Provider,  MD  ondansetron (ZOFRAN-ODT) 8 MG disintegrating tablet Take 1 tablet (8 mg total) by mouth every 8 (eight) hours as needed for nausea. 03/21/16  Yes Sherren Mocha, MD   Social History   Social History  . Marital Status: Single    Spouse Name: n/a  . Number of Children: 0  . Years of Education: N/A   Occupational History  . student     [employer]   Social History Main Topics  . Smoking status: Never Smoker   . Smokeless tobacco: Never Used  . Alcohol Use: No  . Drug Use: No  . Sexual Activity: No   Other Topics Concern  . Not on file   Social History Narrative   Lives with both parents in the same household and his older brother.   Depression screen [facility] 2/9 03/22/2016 03/21/2016  Decreased Interest 0 0  Down, Depressed, Hopeless 0 0  PHQ - 2 Score 0 0   Review of Systems  Constitutional: Positive for fever and diaphoresis.  HENT: Positive for congestion, postnasal drip and sore throat.   Eyes: Positive for photophobia and visual disturbance.  Gastrointestinal: Positive for nausea and diarrhea. Negative for vomiting.  Genitourinary: Positive for dysuria. Negative for discharge and testicular pain.  Musculoskeletal: Positive for back pain.  Skin: Negative for rash.  Neurological: Positive for dizziness and headaches.  Psychiatric/Behavioral: Positive for sleep disturbance.   Objective:  BP 110/76 mmHg  Pulse 86  Temp(Src) 98.4 F (36.9 C) (  Oral)  Resp 16  SpO2 100%  Physical Exam  Constitutional: He appears well-developed and well-nourished. No distress.  HENT:  Head: Normocephalic and atraumatic.  Right Ear: Tympanic membrane normal.  Left Ear: Tympanic membrane normal.  Nose: Rhinorrhea (purulent) present.  Nares had purulent rhinitis oropharynx with beet red erythema Petechia on soft palet  Eyes: Conjunctivae are normal.  Neck: Neck supple. No thyromegaly present.  Cardiovascular: Normal rate, regular rhythm, S1 normal, S2 normal and normal heart sounds.   Exam reveals no gallop and no friction rub.   No murmur heard. Pulmonary/Chest: Effort normal and breath sounds normal. No respiratory distress. He has no wheezes. He has no rales.  Abdominal: Bowel sounds are normal. There is no hepatosplenomegaly. There is no CVA tenderness.  Lymphadenopathy:    He has no cervical adenopathy.  Neurological: He is alert.  Skin: Skin is warm and dry. No rash noted.  No edema  Psychiatric: He has a normal mood and affect. His behavior is normal.  Nursing note and vitals reviewed.  Results for orders placed or performed in visit on 03/22/16  POCT CBC  Result Value Ref Range   WBC 6.3 4.6 - 10.2 K/uL   Lymph, poc 1.3 0.6 - 3.4   POC LYMPH PERCENT 21.3 10 - 50 %L   MID (cbc) 0.4 0 - 0.9   POC MID % 6.5 0 - 12 %M   POC Granulocyte 4.5 2 - 6.9   Granulocyte percent 72.2 37 - 80 %G   RBC 5.12 4.69 - 6.13 M/uL   Hemoglobin 15.6 14.1 - 18.1 g/dL   HCT, POC 16.143.3 (A) 09.643.5 - 53.7 %   MCV 84.6 80 - 97 fL   MCH, POC 30.4 27 - 31.2 pg   MCHC 35.9 (A) 31.8 - 35.4 g/dL   RDW, POC 04.514.3 %   Platelet Count, POC 133 (A) 142 - 424 K/uL   MPV 7.4 0 - 99.8 fL  POCT urinalysis dipstick  Result Value Ref Range   Color, UA yellow yellow   Clarity, UA clear clear   Glucose, UA negative negative   Bilirubin, UA small (A) negative   Ketones, POC UA trace (5) (A) negative   Spec Grav, UA 1.015    Blood, UA trace-intact (A) negative   pH, UA 6.0    Protein Ur, POC =100 (A) negative   Urobilinogen, UA 1.0    Nitrite, UA Negative Negative   Leukocytes, UA Negative Negative  POCT Microscopic Urinalysis ([facility])  Result Value Ref Range   WBC,UR,HPF,POC None None WBC/hpf   RBC,UR,HPF,POC Few (A) None RBC/hpf   Bacteria Few (A) None, Too numerous to count   Mucus Absent Absent   Epithelial Cells, UR Per Microscopy Few (A) None, Too numerous to count cells/hpf   Assessment & Plan:   1. Fever, unspecified   2. Hyponatremia   3. Dehydration   4. Proteinuria   5.  Pharyngitis   suspect viral syndrome - had mono prior but could be recurrence - EBV panel P, consider checking hep C, HIV, RPR, CMV at next eval Throat clx P. Sxs are c/w RMSF other than no rash or tic bite. Cont to treat symptomatically, push fluids - I am concerned he is getting dehydrated Proteinuria/hematuria likely due to dehydration but I am concerned there is a remote possibility pt could be developing nephritis such as AIN Odd that CRP was so high at 11.2 with nml sed rate so recheck. Recheck in 48 hrs, if  worsens prior to that -> to ER.  Orders Placed This Encounter  Procedures  . C-reactive protein  . Comprehensive metabolic panel  . Streptozyme Screen w/rflx to Titer  . POCT CBC  . POCT SEDIMENTATION RATE  . POCT urinalysis dipstick  . POCT Microscopic Urinalysis ([facility])    Meds ordered this encounter  Medications  . HYDROcodone-acetaminophen (HYCET) 7.5-325 mg/15 ml solution    Sig: Take 10-15 mLs by mouth every 4 (four) hours as needed for moderate pain.    Dispense:  180 mL    Refill:  0    I personally performed the services described in this documentation, which was scribed in my presence. The recorded information has been reviewed and considered, and addended by me as needed.  [name], MD MPH

## 2016-03-24 LAB — ROCKY MTN SPOTTED FVR ABS PNL(IGG+IGM)
RMSF IGG: NOT DETECTED
RMSF IGM: NOT DETECTED

## 2016-03-24 LAB — EPSTEIN-BARR VIRUS VCA ANTIBODY PANEL
EBV EA IgG: 13.2 U/mL — ABNORMAL HIGH (ref <9.0)
EBV NA IgG: 43 U/mL — ABNORMAL HIGH (ref <18.0)
EBV VCA IGG: 261 U/mL — AB (ref <18.0)
EBV VCA IgM: <10 U/mL (ref <36.0)

## 2016-03-25 LAB — STREPTOZYME SCREEN W/RFLX TO TITER: Streptozyme screen: POSITIVE — AB

## 2016-03-25 LAB — REFLEX STREPTOZYME TITER

## 2016-08-23 ENCOUNTER — Inpatient Hospital Stay
Admit: 2016-08-23 | Discharge: 2016-08-23 | Disposition: A | Discharge status: Home / Self Care | Payer: PRIVATE HEALTH INSURANCE | Attending: Emergency Medicine

## 2016-08-23 ENCOUNTER — Inpatient Hospital Stay: Admit: 2016-08-23 | Discharge: 2016-08-23 | Attending: Emergency Medicine

## 2016-08-23 DIAGNOSIS — F10129 Alcohol abuse with intoxication, unspecified: Secondary | ICD-10-CM

## 2016-08-23 LAB — CBC WITH AUTOMATED DIFF
ABS. BASOPHILS: 0 10*3/uL (ref 0.0–0.2)
ABS. EOSINOPHILS: 0.1 10*3/uL (ref 0.0–0.8)
ABS. IMM. GRANS.: 0 10*3/uL (ref 0.0–0.5)
ABS. LYMPHOCYTES: 2.3 10*3/uL (ref 0.5–4.6)
ABS. MONOCYTES: 0.4 10*3/uL (ref 0.1–1.3)
ABS. NEUTROPHILS: 3.7 10*3/uL (ref 1.7–8.2)
BASOPHILS: 0 % (ref 0.0–2.0)
EOSINOPHILS: 1 % (ref 0.5–7.8)
HCT: 46.6 % (ref 41.1–50.3)
HGB: 17 g/dL (ref 13.6–17.2)
IMMATURE GRANULOCYTES: 0 % (ref 0.0–5.0)
LYMPHOCYTES: 35 % (ref 13–44)
MCH: 30.9 PG (ref 26.1–32.9)
MCHC: 36.5 g/dL — ABNORMAL HIGH (ref 31.4–35.0)
MCV: 84.7 FL (ref 79.6–97.8)
MONOCYTES: 6 % (ref 4.0–12.0)
MPV: 9.9 FL — ABNORMAL LOW (ref 10.8–14.1)
NEUTROPHILS: 58 % (ref 43–78)
PLATELET: 236 10*3/uL (ref 150–450)
RBC: 5.5 M/uL (ref 4.23–5.67)
RDW: 13.1 % (ref 11.9–14.6)
WBC: 6.5 10*3/uL (ref 4.5–13.5)

## 2016-08-23 LAB — METABOLIC PANEL, COMPREHENSIVE
A-G Ratio: 1.3 (ref 1.2–3.5)
ALT (SGPT): 23 U/L (ref 12–65)
AST (SGOT): 14 U/L — ABNORMAL LOW (ref 15–37)
Albumin: 4.6 g/dL (ref 3.5–5.0)
Alk. phosphatase: 72 U/L (ref 50–136)
Anion gap: 11 mmol/L (ref 7–16)
BUN: 8 MG/DL (ref 6–23)
Bilirubin, total: 0.3 MG/DL (ref 0.2–1.1)
CO2: 26 mmol/L (ref 21–32)
Calcium: 8.8 MG/DL (ref 8.3–10.4)
Chloride: 110 mmol/L — ABNORMAL HIGH (ref 98–107)
Creatinine: 0.9 MG/DL (ref 0.8–1.5)
GFR est AA: >60 mL/min/{1.73_m2} (ref >60)
GFR est non-AA: >60 mL/min/{1.73_m2} (ref >60)
Globulin: 3.6 g/dL — ABNORMAL HIGH (ref 2.3–3.5)
Glucose: 99 mg/dL (ref 65–100)
Potassium: 3.7 mmol/L (ref 3.5–5.1)
Protein, total: 8.2 g/dL (ref 6.3–8.2)
Sodium: 147 mmol/L — ABNORMAL HIGH (ref 136–145)

## 2016-08-23 LAB — DRUG SCREEN, URINE
AMPHETAMINES: POSITIVE
BARBITURATES: NEGATIVE
BENZODIAZEPINES: NEGATIVE
COCAINE: NEGATIVE
METHADONE: NEGATIVE
OPIATES: NEGATIVE
PCP(PHENCYCLIDINE): NEGATIVE
THC (TH-CANNABINOL): NEGATIVE

## 2016-08-23 LAB — SALICYLATE: Salicylate level: <1.7 MG/DL — ABNORMAL LOW (ref 2.8–20.0)

## 2016-08-23 LAB — TSH 3RD GENERATION: TSH: 0.542 u[IU]/mL (ref 0.358–3.740)

## 2016-08-23 LAB — ACETAMINOPHEN: Acetaminophen level: <2 ug/mL — ABNORMAL LOW (ref 10.0–30.0)

## 2016-08-23 LAB — ETHYL ALCOHOL: ALCOHOL(ETHYL),SERUM: 284 MG/DL

## 2016-08-23 LAB — LIPASE: Lipase: 103 U/L (ref 73–393)

## 2016-08-23 LAB — MAGNESIUM: Magnesium: 2.5 mg/dL — ABNORMAL HIGH (ref 1.8–2.4)

## 2016-08-23 MED ORDER — SODIUM CHLORIDE 0.9 % IJ SYRG
INTRAMUSCULAR | Status: DC | PRN
Start: 2016-08-23 — End: 2016-08-23

## 2016-08-23 MED ORDER — SODIUM CHLORIDE 0.9 % IJ SYRG
Freq: Three times a day (TID) | INTRAMUSCULAR | Status: DC
Start: 2016-08-23 — End: 2016-08-23

## 2016-08-23 MED ORDER — SODIUM CHLORIDE 0.9% BOLUS IV
0.9 % | Freq: Once | INTRAVENOUS | Status: DC
Start: 2016-08-23 — End: 2016-08-23

## 2016-08-23 MED ORDER — PROMETHAZINE 25 MG/ML INJECTION
25 mg/mL | INTRAMUSCULAR | Status: DC
Start: 2016-08-23 — End: 2016-08-23

## 2016-08-23 NOTE — ED Notes (Signed)
Pt's father in room. Pt awake ambulatory to restroom

## 2016-08-23 NOTE — ED Notes (Signed)
Report from Nick, RN. Care assumed at this time

## 2016-08-23 NOTE — ED Notes (Signed)
Pt given breakfast, telepsych at bedside

## 2016-08-23 NOTE — ED Notes (Signed)
I have reviewed discharge instructions with the patient.  The patient verbalized understanding.    Patient left ED via Discharge Method: ambulatory to Home with father).    Opportunity for questions and clarification provided.       Patient given 0 scripts.

## 2016-08-23 NOTE — ED Notes (Signed)
Pt arrives via EMS from dorms at [employer]. Pt's friend states he was requesting help with anxiety due to academic stress. Pt admits to drinking a fifth of everclear tonight. Denies drug use. Pt alert and oriented at time of triage.

## 2016-08-23 NOTE — ED Provider Notes (Addendum)
HPI Comments: Patient has recurrent intoxicated and very distraught.  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  He denies any  Medication ingestions he is on Vyvanse for ADHD.    Patient is a 20 y.o. male presenting with intoxication and suicidal ideation. The history is provided by the patient.   Alcohol intoxication   Primary symptoms include: agitation, self-injury and intoxication.  There areno confusion, no somnolence, no loss of consciousness, no seizures, no weakness, no delusions, no hallucinations and no violence present at this time.  This is a new problem. The current episode started 3 to 5 hours ago. The problem has not changed since onset.Suspected agents include alcohol. Associated symptoms include nausea. Pertinent negatives include no fever, no injury, no vomiting, no bladder incontinence and no bowel incontinence. Associated medical issues do not include mental status change.   Suicidal   This is a new problem. The current episode started 6 to 12 hours ago. The problem has not changed since onset.There was no focality noted. Primary symptoms include agitation.Pertinent negatives include no focal weakness, no loss of sensation, no loss of balance, no slurred speech, no speech difficulty, no movement disorder, no visual change, no mental status change, no unresponsiveness and no disorientation. There has been no fever. Associated symptoms include nausea. Pertinent negatives include no shortness of breath, no chest pain, no vomiting, no altered mental status, no confusion, no headaches, no choking, no bowel incontinence and no bladder incontinence.        No past medical history on file.     No past surgical history on file.      No family history on file.    Social History     Social History   ??? Marital status: N/A     Spouse name: N/A   ??? Number of children: N/A   ??? Years of education: N/A     Occupational History   ??? Not on file.     Social History Main Topics   ??? Smoking status: Not on file   ??? Smokeless tobacco: Not on file   ??? Alcohol use Not on file   ??? Drug use: Not on file   ??? Sexual activity: Not on file     Other Topics Concern   ??? Not on file     Social History Narrative         ALLERGIES: Review of patient's allergies indicates not on file.    Review of Systems   Constitutional: Negative for fever.   Respiratory: Negative for choking and shortness of breath.    Cardiovascular: Negative for chest pain.   Gastrointestinal: Positive for nausea. Negative for bowel incontinence and vomiting.   Genitourinary: Negative for bladder incontinence.   Neurological: Negative for focal weakness, seizures, loss of consciousness, speech difficulty, weakness, headaches and loss of balance.   Psychiatric/Behavioral: Positive for agitation and self-injury. Negative for confusion and hallucinations.   All other systems reviewed and are negative.      Vitals:    08/23/16 0026   BP: 124/67   Pulse: 85   Resp: 18   Temp: 98.1 ??F (36.7 ??C)   SpO2: 98%   Weight:  70.3 kg (155 lb)   Height: 6' (1.829 m)            Physical Exam   Constitutional: He is oriented to person, place, and time. He appears well-developed and well-nourished. No distress.   HENT:   Head: Normocephalic and atraumatic.   Eyes: Conjunctivae and EOM are normal. Pupils are equal, round, and reactive to light.   Neck: Normal range of motion. Neck supple.   Cardiovascular: Normal rate, regular rhythm and normal heart sounds.    Pulmonary/Chest: Effort normal and breath sounds normal.   Abdominal: Soft. Bowel sounds are normal.   Musculoskeletal: Normal range of motion. He exhibits no edema, tenderness or deformity.    Neurological: He is alert and oriented to person, place, and time.   Skin: Skin is warm and dry.   Psychiatric: His affect is labile. His speech is rapid and/or pressured. He is withdrawn. He exhibits a depressed mood. He expresses suicidal ideation.   Nursing note and vitals reviewed.       MDM  Number of Diagnoses or Management Options     Amount and/or Complexity of Data Reviewed  Clinical lab tests: ordered and reviewed  Independent visualization of images, tracings, or specimens: yes    Risk of Complications, Morbidity, and/or Mortality  Presenting problems: high  Diagnostic procedures: moderate  Management options: moderate    Patient Progress  Patient progress: stable    ED Course       Procedures      Serum drug screen is negative alcohol levels 284.  The patient require time to detoxify from his alcohol prior to psychiatric evaluation

## 2016-08-23 NOTE — ED Notes (Signed)
Telephone communication with pt's father Freida Busmanllen. [place] [facility] [place] [facility] [place] [facility]. Telephone [phone]

## 2016-08-23 NOTE — ED Notes (Signed)
Telepsych in room, plugged in and on. Lights turned out, pt aware he has to speak to telepsych soon. Breakfast to be ordered for pt

## 2019-11-30 ENCOUNTER — Ambulatory Visit

## 2019-11-30 ENCOUNTER — Ambulatory Visit: Payer: BLUE CROSS/BLUE SHIELD

## 2019-11-30 DIAGNOSIS — Z23 Encounter for immunization: Secondary | ICD-10-CM

## 2020-06-05 DIAGNOSIS — F988 Other specified behavioral and emotional disorders with onset usually occurring in childhood and adolescence: Secondary | ICD-10-CM | POA: Diagnosis not present

## 2020-06-05 DIAGNOSIS — Z23 Encounter for immunization: Secondary | ICD-10-CM | POA: Diagnosis not present
